# Patient Record
Sex: Male | Born: 1970 | Race: White | Hispanic: No | Marital: Married
Health system: Southern US, Community
[De-identification: ages and names within clinical notes are randomized; demographics above are authoritative.]

---

## 2006-01-12 ENCOUNTER — Ambulatory Visit: Payer: Self-pay | Admitting: Family Medicine

## 2006-02-18 ENCOUNTER — Ambulatory Visit: Payer: Self-pay | Admitting: Family Medicine

## 2006-12-15 ENCOUNTER — Ambulatory Visit: Payer: Self-pay | Admitting: Family Medicine

## 2006-12-26 ENCOUNTER — Encounter: Admission: RE | Admit: 2006-12-26 | Discharge: 2006-12-26 | Payer: Self-pay | Admitting: Family Medicine

## 2007-04-15 ENCOUNTER — Ambulatory Visit: Payer: Self-pay | Admitting: Family Medicine

## 2008-02-28 ENCOUNTER — Ambulatory Visit: Payer: Self-pay | Admitting: Family Medicine

## 2009-04-11 ENCOUNTER — Ambulatory Visit: Payer: Self-pay | Admitting: Family Medicine

## 2010-02-06 ENCOUNTER — Ambulatory Visit: Payer: Self-pay | Admitting: Family Medicine

## 2016-08-19 DIAGNOSIS — Z0000 Encounter for general adult medical examination without abnormal findings: Secondary | ICD-10-CM | POA: Diagnosis not present

## 2016-08-19 DIAGNOSIS — Z125 Encounter for screening for malignant neoplasm of prostate: Secondary | ICD-10-CM | POA: Diagnosis not present

## 2016-08-19 DIAGNOSIS — R7303 Prediabetes: Secondary | ICD-10-CM | POA: Diagnosis not present

## 2016-08-26 DIAGNOSIS — M25562 Pain in left knee: Secondary | ICD-10-CM | POA: Diagnosis not present

## 2016-08-26 DIAGNOSIS — Z0000 Encounter for general adult medical examination without abnormal findings: Secondary | ICD-10-CM | POA: Diagnosis not present

## 2016-08-26 DIAGNOSIS — R7303 Prediabetes: Secondary | ICD-10-CM | POA: Diagnosis not present

## 2017-02-17 DIAGNOSIS — M179 Osteoarthritis of knee, unspecified: Secondary | ICD-10-CM | POA: Diagnosis not present

## 2017-02-17 DIAGNOSIS — M25562 Pain in left knee: Secondary | ICD-10-CM | POA: Diagnosis not present

## 2017-07-31 DIAGNOSIS — K045 Chronic apical periodontitis: Secondary | ICD-10-CM | POA: Diagnosis not present

## 2017-09-22 DIAGNOSIS — R7303 Prediabetes: Secondary | ICD-10-CM | POA: Diagnosis not present

## 2017-09-22 DIAGNOSIS — Z125 Encounter for screening for malignant neoplasm of prostate: Secondary | ICD-10-CM | POA: Diagnosis not present

## 2017-09-22 DIAGNOSIS — Z0000 Encounter for general adult medical examination without abnormal findings: Secondary | ICD-10-CM | POA: Diagnosis not present

## 2017-09-29 DIAGNOSIS — R030 Elevated blood-pressure reading, without diagnosis of hypertension: Secondary | ICD-10-CM | POA: Diagnosis not present

## 2017-09-29 DIAGNOSIS — Z0000 Encounter for general adult medical examination without abnormal findings: Secondary | ICD-10-CM | POA: Diagnosis not present

## 2018-09-28 DIAGNOSIS — Z0000 Encounter for general adult medical examination without abnormal findings: Secondary | ICD-10-CM | POA: Diagnosis not present

## 2018-10-05 DIAGNOSIS — R7303 Prediabetes: Secondary | ICD-10-CM | POA: Diagnosis not present

## 2018-10-05 DIAGNOSIS — Z0000 Encounter for general adult medical examination without abnormal findings: Secondary | ICD-10-CM | POA: Diagnosis not present

## 2018-10-05 DIAGNOSIS — Z7189 Other specified counseling: Secondary | ICD-10-CM | POA: Diagnosis not present

## 2019-04-19 DIAGNOSIS — H10413 Chronic giant papillary conjunctivitis, bilateral: Secondary | ICD-10-CM | POA: Diagnosis not present

## 2019-04-19 DIAGNOSIS — H524 Presbyopia: Secondary | ICD-10-CM | POA: Diagnosis not present

## 2019-04-19 DIAGNOSIS — H52202 Unspecified astigmatism, left eye: Secondary | ICD-10-CM | POA: Diagnosis not present

## 2019-10-18 DIAGNOSIS — Z125 Encounter for screening for malignant neoplasm of prostate: Secondary | ICD-10-CM | POA: Diagnosis not present

## 2019-10-18 DIAGNOSIS — R7303 Prediabetes: Secondary | ICD-10-CM | POA: Diagnosis not present

## 2019-10-18 DIAGNOSIS — Z0000 Encounter for general adult medical examination without abnormal findings: Secondary | ICD-10-CM | POA: Diagnosis not present

## 2019-10-18 DIAGNOSIS — Z13220 Encounter for screening for lipoid disorders: Secondary | ICD-10-CM | POA: Diagnosis not present

## 2019-10-25 DIAGNOSIS — R7303 Prediabetes: Secondary | ICD-10-CM | POA: Diagnosis not present

## 2019-10-25 DIAGNOSIS — M25511 Pain in right shoulder: Secondary | ICD-10-CM | POA: Diagnosis not present

## 2019-10-25 DIAGNOSIS — Z0000 Encounter for general adult medical examination without abnormal findings: Secondary | ICD-10-CM | POA: Diagnosis not present

## 2020-01-17 DIAGNOSIS — M25511 Pain in right shoulder: Secondary | ICD-10-CM | POA: Diagnosis not present

## 2020-01-17 DIAGNOSIS — M7501 Adhesive capsulitis of right shoulder: Secondary | ICD-10-CM | POA: Diagnosis not present

## 2020-01-23 DIAGNOSIS — M7501 Adhesive capsulitis of right shoulder: Secondary | ICD-10-CM | POA: Diagnosis not present

## 2020-01-23 DIAGNOSIS — M25511 Pain in right shoulder: Secondary | ICD-10-CM | POA: Diagnosis not present

## 2020-02-01 DIAGNOSIS — M25511 Pain in right shoulder: Secondary | ICD-10-CM | POA: Diagnosis not present

## 2020-02-01 DIAGNOSIS — M7501 Adhesive capsulitis of right shoulder: Secondary | ICD-10-CM | POA: Diagnosis not present

## 2020-02-21 DIAGNOSIS — M7501 Adhesive capsulitis of right shoulder: Secondary | ICD-10-CM | POA: Diagnosis not present

## 2020-02-21 DIAGNOSIS — M25511 Pain in right shoulder: Secondary | ICD-10-CM | POA: Diagnosis not present

## 2020-09-13 DIAGNOSIS — R350 Frequency of micturition: Secondary | ICD-10-CM | POA: Diagnosis not present

## 2020-09-15 ENCOUNTER — Other Ambulatory Visit: Payer: Self-pay

## 2020-09-15 ENCOUNTER — Encounter (HOSPITAL_COMMUNITY): Payer: Self-pay | Admitting: Emergency Medicine

## 2020-09-15 ENCOUNTER — Emergency Department (HOSPITAL_COMMUNITY)
Admission: EM | Admit: 2020-09-15 | Discharge: 2020-09-15 | Disposition: A | Discharge status: Home / Self Care | Payer: BC Managed Care – PPO | Attending: Emergency Medicine | Admitting: Emergency Medicine

## 2020-09-15 ENCOUNTER — Emergency Department (HOSPITAL_COMMUNITY): Payer: BC Managed Care – PPO

## 2020-09-15 DIAGNOSIS — N23 Unspecified renal colic: Secondary | ICD-10-CM

## 2020-09-15 DIAGNOSIS — N201 Calculus of ureter: Secondary | ICD-10-CM | POA: Diagnosis not present

## 2020-09-15 DIAGNOSIS — R109 Unspecified abdominal pain: Secondary | ICD-10-CM | POA: Diagnosis not present

## 2020-09-15 LAB — URINALYSIS, ROUTINE W REFLEX MICROSCOPIC
Bilirubin Urine: NEGATIVE
Glucose, UA: NEGATIVE mg/dL
Ketones, ur: NEGATIVE mg/dL
Leukocytes,Ua: NEGATIVE
Nitrite: NEGATIVE
Protein, ur: 30 mg/dL — AB
Specific Gravity, Urine: 1.015 (ref 1.005–1.030)
pH: 8 (ref 5.0–8.0)

## 2020-09-15 MED ORDER — KETOROLAC TROMETHAMINE 60 MG/2ML IM SOLN
30.0000 mg | Freq: Once | INTRAMUSCULAR | Status: AC
  Administered 2020-09-15: 30 mg via INTRAMUSCULAR
  Filled 2020-09-15: qty 2

## 2020-09-15 MED ORDER — IBUPROFEN 600 MG PO TABS: 600.0000 mg | ORAL_TABLET | Freq: Four times a day (QID) | ORAL | 0 refills | Status: AC | PRN

## 2020-09-15 MED ORDER — TAMSULOSIN HCL 0.4 MG PO CAPS
0.4000 mg | ORAL_CAPSULE | Freq: Once | ORAL | Status: AC
  Administered 2020-09-15: 0.4 mg via ORAL
  Filled 2020-09-15: qty 1

## 2020-09-15 MED ORDER — TAMSULOSIN HCL 0.4 MG PO CAPS: 0.4000 mg | ORAL_CAPSULE | Freq: Every day | ORAL | 0 refills | Status: AC

## 2020-09-15 NOTE — ED Triage Notes (Signed)
Pt reports right sided abdominal pain that began Wednesday. Pain radiates from right side to lower abdomen. Pt was seen by PCP for complaints. Describes pain is "locked up" and cramping. Pt also reports dysuria.

## 2020-09-15 NOTE — ED Notes (Signed)
RN called lab for update on urine sample. Lab is running it now.

## 2020-09-15 NOTE — ED Notes (Signed)
Patient transported to CT 

## 2020-09-15 NOTE — ED Provider Notes (Signed)
[facility]-EMERGENCY DEPT Provider Note  CSN: [id number] Arrival date & time: 09/15/20 0013  Chief Complaint(s) Abdominal Pain and Dysuria  HPI Marco King is a 50 y.o. male here for right flank pain. Intermittent since Wednesday. Associated with dysuria and urgency. Pain became significantly worse 30 minutes prior to arrival. Reports that it was deep and intense.  Radiating from the right flank down to the growing area. Associated with nausea without emesis. Patient has since subsided since being here.  But is fluctuating in intensity. No recent fevers or infections. No coughing or congestion.. No diarrhea.  Patient reports being seen by PCP and had a UA that did not show any infection but he was prescribed doxycycline in case there was an infection.  He has taken 3 doses.  No known h/o renal stones.   HPI  Past Medical History History reviewed. No pertinent past medical history. There are no problems to display for this patient.  Home Medication(s) Prior to Admission medications   Medication Sig Start Date End Date Taking? Authorizing Provider  ibuprofen (ADVIL) 600 MG tablet Take 1 tablet (600 mg total) by mouth every 6 (six) hours as needed. 09/15/20  Yes Coburn Knaus, Amadeo Garnet, MD  tamsulosin (FLOMAX) 0.4 MG CAPS capsule Take 1 capsule (0.4 mg total) by mouth daily for 7 days. 09/15/20 09/22/20 Yes Moksh Loomer, Amadeo Garnet, MD                                                                                                                                    Past Surgical History History reviewed. No pertinent surgical history. Family History No family history on file.  Social History   Allergies Patient has no known allergies.  Review of Systems Review of Systems All other systems are reviewed and are negative for acute change except as noted in the HPI  Physical Exam Vital Signs  I have reviewed the triage vital signs BP (!) 151/108   Pulse  70   Temp 98.3 F (36.8 C) (Oral)   Resp 17   Ht 6\' 2"  (1.88 m)   Wt 90.7 kg   SpO2 99%   BMI 25.68 kg/m   Physical Exam Vitals reviewed.  Constitutional:      General: He is not in acute distress.    Appearance: He is well-developed. He is not diaphoretic.  HENT:     Head: Normocephalic and atraumatic.     Jaw: No trismus.     Right Ear: External ear normal.     Left Ear: External ear normal.     Nose: Nose normal.  Eyes:     General: No scleral icterus.    Conjunctiva/sclera: Conjunctivae normal.  Neck:     Trachea: Phonation normal.  Cardiovascular:     Rate and Rhythm: Normal rate and regular rhythm.  Pulmonary:     Effort: Pulmonary effort is normal. No respiratory distress.  Breath sounds: No stridor.  Abdominal:     General: There is no distension.  Musculoskeletal:        General: Normal range of motion.     Cervical back: Normal range of motion.  Neurological:     Mental Status: He is alert and oriented to person, place, and time.  Psychiatric:        Behavior: Behavior normal.     ED Results and Treatments Labs (all labs ordered are listed, but only abnormal results are displayed) Labs Reviewed  URINALYSIS, ROUTINE W REFLEX MICROSCOPIC - Abnormal; Notable for the following components:      Result Value   APPearance CLOUDY (*)    Hgb urine dipstick SMALL (*)    Protein, ur 30 (*)    Bacteria, UA RARE (*)    All other components within normal limits                                                                                                                         EKG  EKG Interpretation  Date/Time:    Ventricular Rate:    PR Interval:    QRS Duration:   QT Interval:    QTC Calculation:   R Axis:     Text Interpretation:        Radiology No results found.  CT STONE STUDY:  IMPRESSION: 1. Mild right hydronephrosis and hydroureter with stranding around the right ureter. Stone in the right posterior bladder wall region likely  represents a retained stone in the right ureterovesical junction although a recently passed stone could also have this appearance. 2. No stones or hydronephrosis on the left.  Pertinent labs & imaging results that were available during my care of the patient were reviewed by me and considered in my medical decision making (see chart for details).  Medications Ordered in ED Medications  tamsulosin (FLOMAX) capsule 0.4 mg (has no administration in time range)  ketorolac (TORADOL) injection 30 mg (30 mg Intramuscular Given 09/15/20 0106)                                                                                                                                    Procedures Procedures  (including critical care time)  Medical Decision Making / ED Course I have reviewed the nursing notes for this encounter and the patient's prior records (if available in EHR or on provided paperwork).  Marco King was evaluated in Emergency Department on 09/15/2020 for the symptoms described in the history of present illness. He was evaluated in the context of the global COVID-19 pandemic, which necessitated consideration that the patient might be at risk for infection with the SARS-CoV-2 virus that causes COVID-19. Institutional protocols and algorithms that pertain to the evaluation of patients at risk for COVID-19 are in a state of rapid change based on information released by regulatory bodies including the CDC and federal and state organizations. These policies and algorithms were followed during the patient's care in the ED.  Right renal colic from 74mm stone. UA w/o evidence of infection. Pain controlled with Toradol. Given flomax.       Final Clinical Impression(s) / ED Diagnoses Final diagnoses:  Renal colic on right side  Ureterolithiasis   The patient appears reasonably screened and/or stabilized for discharge and I doubt any other medical condition or other [facility] requiring further  screening, evaluation, or treatment in the ED at this time prior to discharge. Safe for discharge with strict return precautions.  Disposition: Discharge  Condition: Good  I have discussed the results, Dx and Tx plan with the patient/family who expressed understanding and agree(s) with the plan. Discharge instructions discussed at length. The patient/family was given strict return precautions who verbalized understanding of the instructions. No further questions at time of discharge.    ED Discharge Orders         Ordered    ibuprofen (ADVIL) 600 MG tablet  Every 6 hours PRN        09/15/20 0243    tamsulosin (FLOMAX) 0.4 MG CAPS capsule  Daily        09/15/20 0243          Follow Up: Georgianne Fick, MD [address] [place] [address] [place] [address] [phone]  Call  as needed  Crista Elliot, MD [address] [place] [address] [phone]  Call  as needed      This chart was dictated using voice recognition software.  Despite best efforts to proofread,  errors can occur which can change the documentation meaning.   Nira Conn, MD 09/15/20 [phone]

## 2020-09-15 NOTE — ED Notes (Signed)
Patient back from CT.

## 2020-10-17 DIAGNOSIS — N200 Calculus of kidney: Secondary | ICD-10-CM | POA: Diagnosis not present

## 2020-10-24 DIAGNOSIS — R7303 Prediabetes: Secondary | ICD-10-CM | POA: Diagnosis not present

## 2020-10-24 DIAGNOSIS — R030 Elevated blood-pressure reading, without diagnosis of hypertension: Secondary | ICD-10-CM | POA: Diagnosis not present

## 2020-10-24 DIAGNOSIS — N200 Calculus of kidney: Secondary | ICD-10-CM | POA: Diagnosis not present

## 2020-10-30 DIAGNOSIS — Z125 Encounter for screening for malignant neoplasm of prostate: Secondary | ICD-10-CM | POA: Diagnosis not present

## 2020-10-30 DIAGNOSIS — Z0000 Encounter for general adult medical examination without abnormal findings: Secondary | ICD-10-CM | POA: Diagnosis not present

## 2020-11-13 DIAGNOSIS — R7303 Prediabetes: Secondary | ICD-10-CM | POA: Diagnosis not present

## 2020-11-13 DIAGNOSIS — Z0000 Encounter for general adult medical examination without abnormal findings: Secondary | ICD-10-CM | POA: Diagnosis not present

## 2020-11-13 DIAGNOSIS — N200 Calculus of kidney: Secondary | ICD-10-CM | POA: Diagnosis not present

## 2020-12-03 DIAGNOSIS — Z1211 Encounter for screening for malignant neoplasm of colon: Secondary | ICD-10-CM | POA: Diagnosis not present

## 2020-12-03 DIAGNOSIS — Z800 Family history of malignant neoplasm of digestive organs: Secondary | ICD-10-CM | POA: Diagnosis not present

## 2021-01-15 DIAGNOSIS — Z1211 Encounter for screening for malignant neoplasm of colon: Secondary | ICD-10-CM | POA: Diagnosis not present

## 2021-06-20 DIAGNOSIS — H5212 Myopia, left eye: Secondary | ICD-10-CM | POA: Diagnosis not present

## 2021-06-20 DIAGNOSIS — H10413 Chronic giant papillary conjunctivitis, bilateral: Secondary | ICD-10-CM | POA: Diagnosis not present

## 2021-11-19 DIAGNOSIS — R7303 Prediabetes: Secondary | ICD-10-CM | POA: Diagnosis not present

## 2021-11-19 DIAGNOSIS — Z0000 Encounter for general adult medical examination without abnormal findings: Secondary | ICD-10-CM | POA: Diagnosis not present

## 2021-11-19 DIAGNOSIS — Z125 Encounter for screening for malignant neoplasm of prostate: Secondary | ICD-10-CM | POA: Diagnosis not present

## 2021-12-03 DIAGNOSIS — R7303 Prediabetes: Secondary | ICD-10-CM | POA: Diagnosis not present

## 2021-12-03 DIAGNOSIS — Z0000 Encounter for general adult medical examination without abnormal findings: Secondary | ICD-10-CM | POA: Diagnosis not present

## 2021-12-18 DIAGNOSIS — Z23 Encounter for immunization: Secondary | ICD-10-CM | POA: Diagnosis not present

## 2022-03-18 DIAGNOSIS — Z23 Encounter for immunization: Secondary | ICD-10-CM | POA: Diagnosis not present

## 2022-06-09 IMAGING — CT CT RENAL STONE PROTOCOL
2 of 4 series · 16 of 46 positions shown, 18 images · non-contrast
Comparison: 12/26/2006

CLINICAL DATA: Right-sided abdominal and flank pain extending to
the lower abdomen.

EXAM:
CT ABDOMEN AND PELVIS WITHOUT CONTRAST
TECHNIQUE: Multidetector CT imaging of the abdomen and pelvis was performed
following the standard protocol without IV contrast.

[Series 2: axial st · axial · 0.84mm/px · z∈[-503,-73]mm · 13 of 98 slices shown, 15 images]
[im 6/98  soft-tissue]
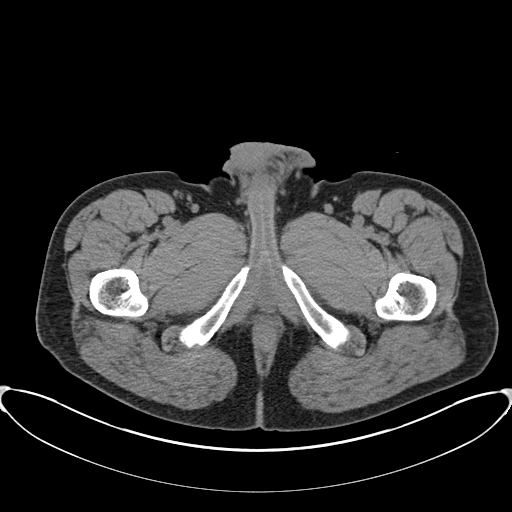
[im 6/98  bone]
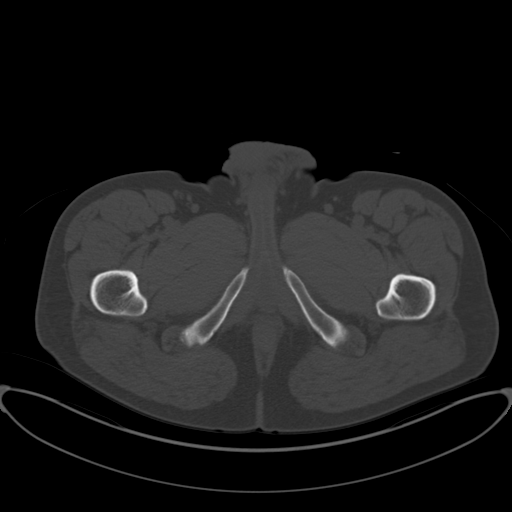
[im 12/98  soft-tissue]
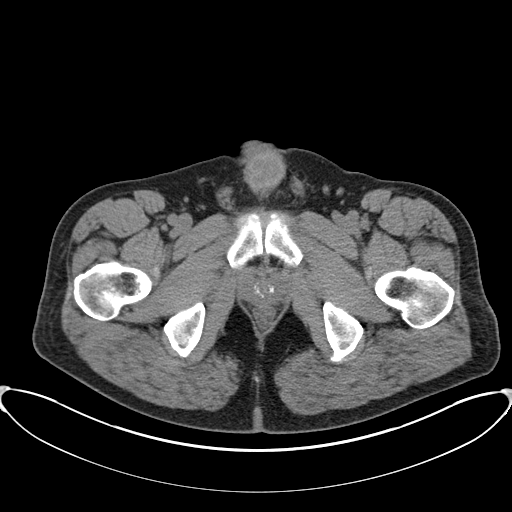
[im 23/98  soft-tissue]
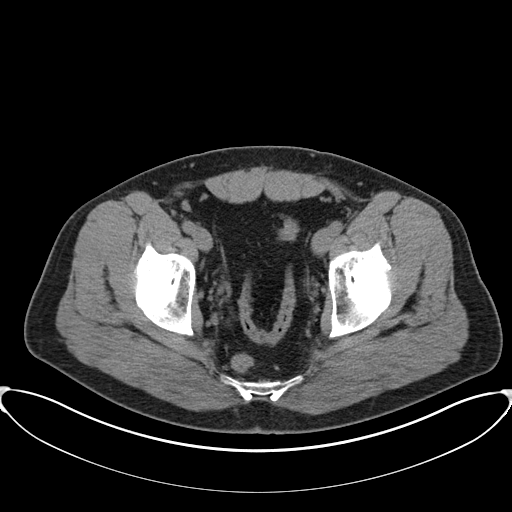
[im 29/98  soft-tissue]
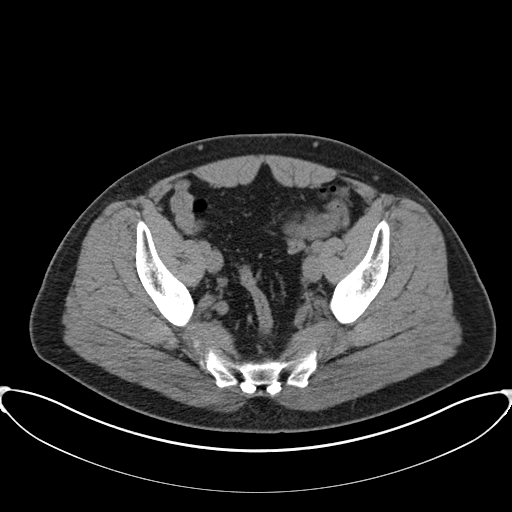
[im 35/98  soft-tissue]
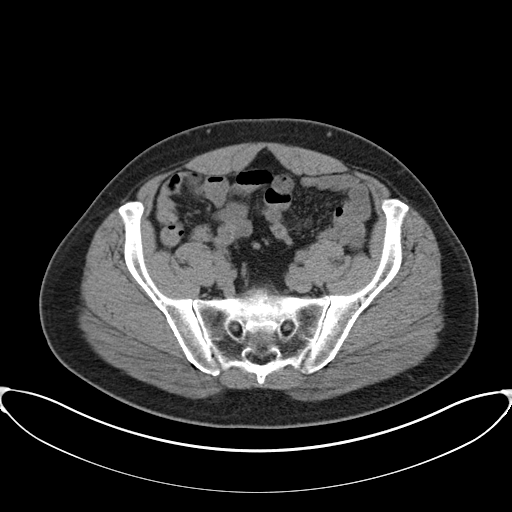
[im 40/98  soft-tissue]
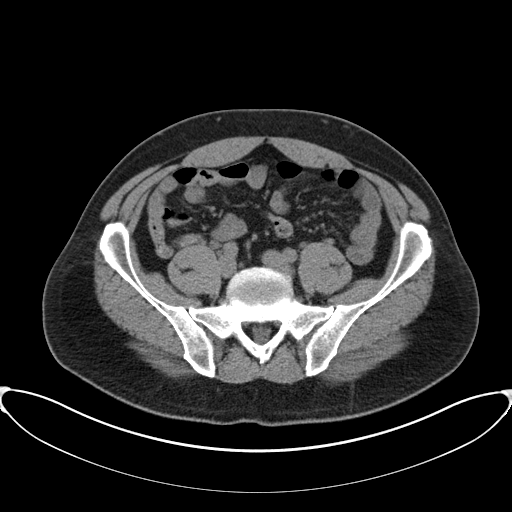
[im 52/98  soft-tissue]
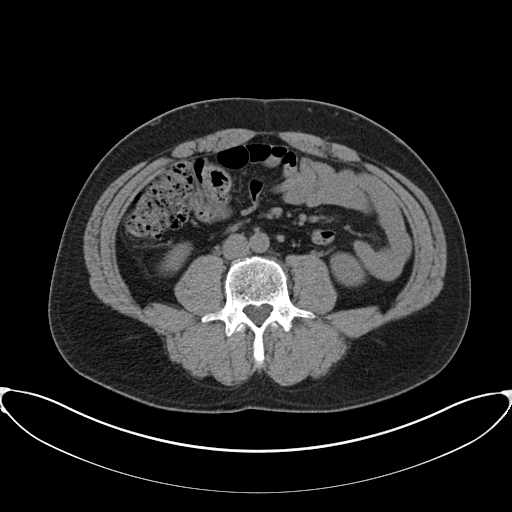
[im 58/98  soft-tissue]
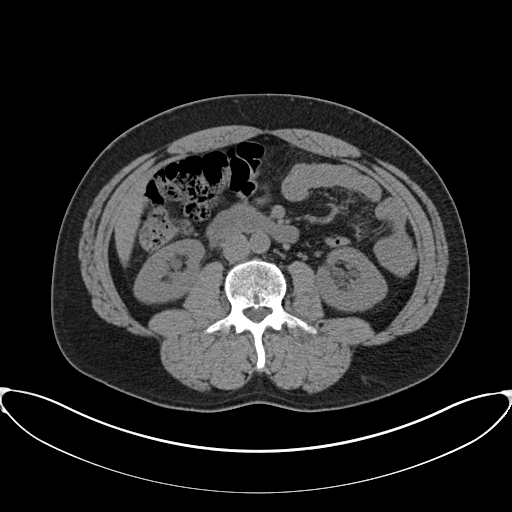
[im 63/98  soft-tissue]
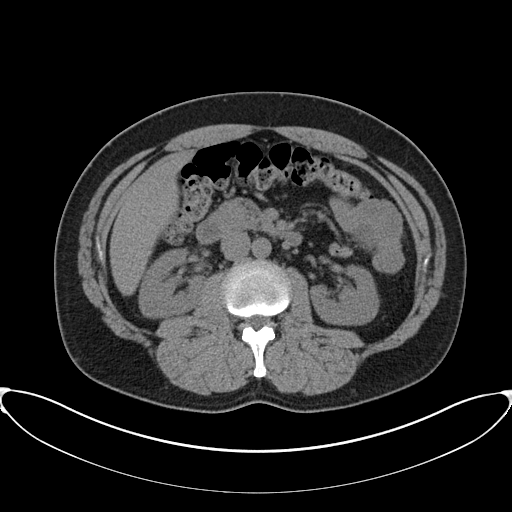
[im 63/98  bone]
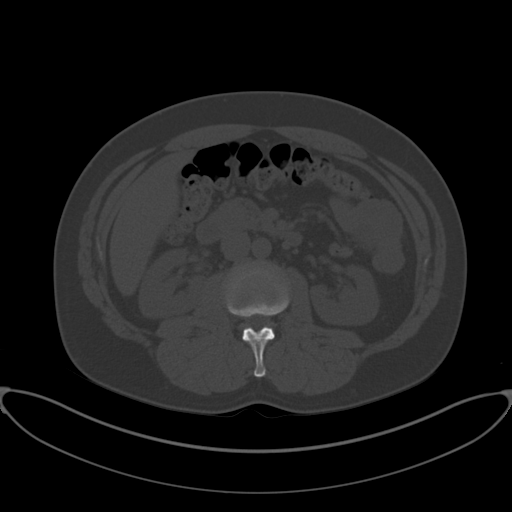
[im 69/98  soft-tissue]
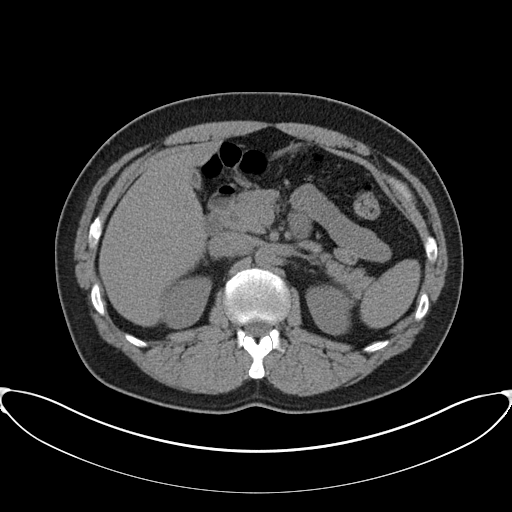
[im 75/98  soft-tissue]
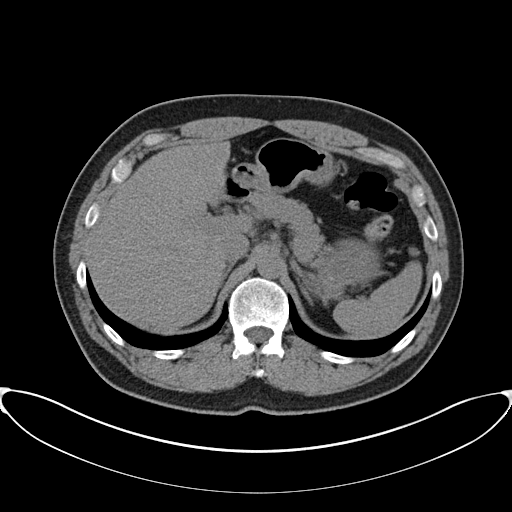
[im 86/98  soft-tissue]
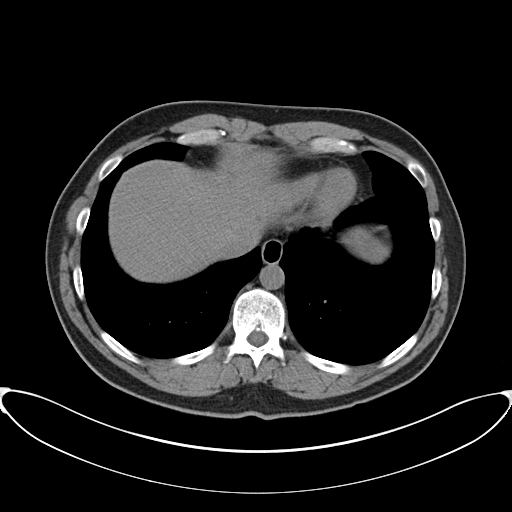
[im 92/98  soft-tissue]
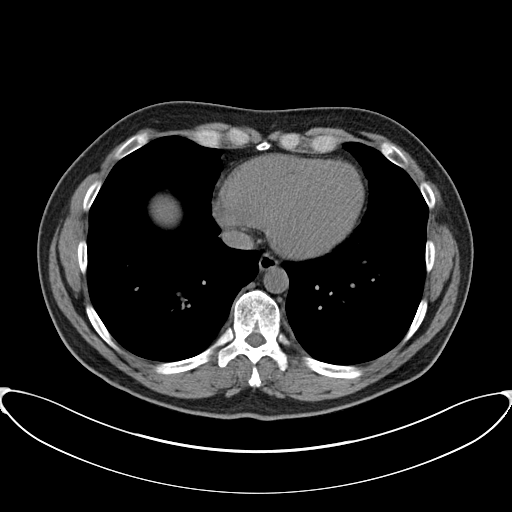

[Series 5: coronal · coronal · 0.77mm/px · 3 of 150 slices shown]
[im 50/150  soft-tissue]
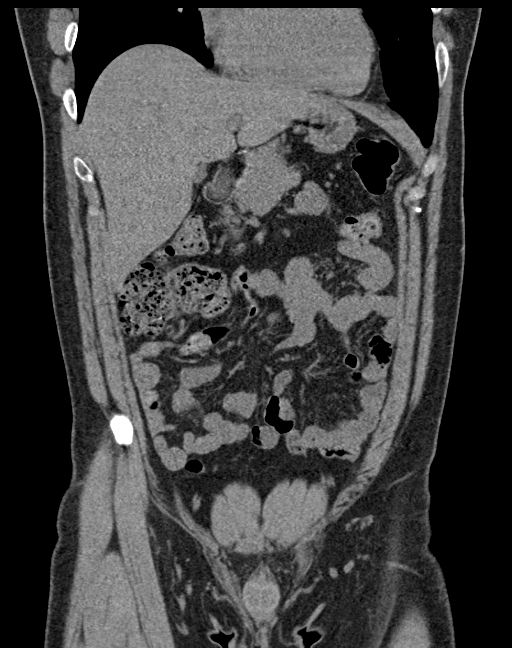
[im 67/150  soft-tissue]
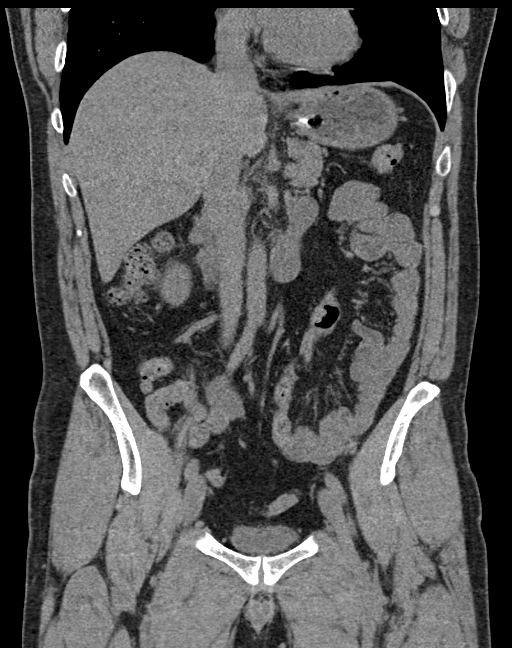
[im 83/150  soft-tissue]
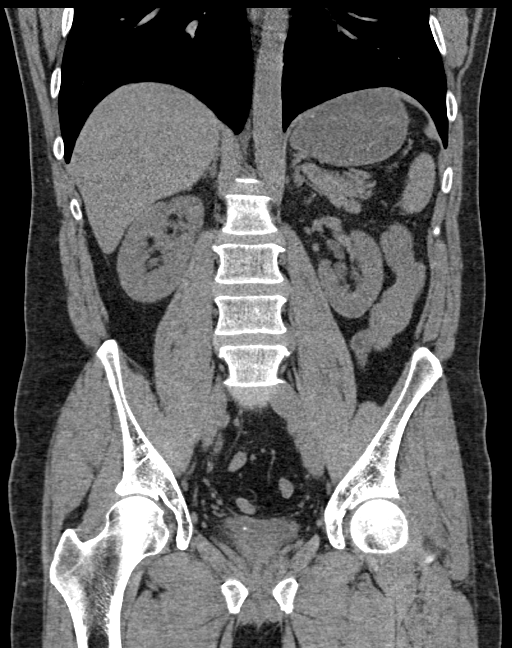

[16 of 46 positions shown; findings below may reference images not displayed]

FINDINGS: Lower chest: The lung bases are clear.

Hepatobiliary: No focal liver abnormality is seen. No gallstones,
gallbladder wall thickening, or biliary dilatation.

Pancreas: Unremarkable. No pancreatic ductal dilatation or
surrounding inflammatory changes.

Spleen: Normal in size without focal abnormality.

Adrenals/Urinary Tract: No adrenal gland nodules. Mild right
hydronephrosis and hydroureter with mild stranding around the right
ureter. Stone in the right posterior bladder wall region likely
represents a retained stone in the right ureterovesical junction
although a recently passed stone could also have this appearance.
The stone measures about 5 mm in diameter. No stones or
hydronephrosis on the left. Bladder is mostly decompressed.

Stomach/Bowel: Stomach is within normal limits. Appendix appears
normal. No evidence of bowel wall thickening, distention, or
inflammatory changes.

Vascular/Lymphatic: No significant vascular findings are present. No
enlarged abdominal or pelvic lymph nodes.

Reproductive: Prostate is unremarkable.

Other: No abdominal wall hernia or abnormality. No abdominopelvic
ascites.

Musculoskeletal: No acute or significant osseous findings.
IMPRESSION: 1. Mild right hydronephrosis and hydroureter with stranding around
the right ureter. Stone in the right posterior bladder wall region
likely represents a retained stone in the right ureterovesical
junction although a recently passed stone could also have this
appearance.
2. No stones or hydronephrosis on the left.

## 2022-06-25 DIAGNOSIS — H524 Presbyopia: Secondary | ICD-10-CM | POA: Diagnosis not present

## 2022-06-25 DIAGNOSIS — H10413 Chronic giant papillary conjunctivitis, bilateral: Secondary | ICD-10-CM | POA: Diagnosis not present

## 2022-12-08 DIAGNOSIS — Z125 Encounter for screening for malignant neoplasm of prostate: Secondary | ICD-10-CM | POA: Diagnosis not present

## 2022-12-08 DIAGNOSIS — Z0000 Encounter for general adult medical examination without abnormal findings: Secondary | ICD-10-CM | POA: Diagnosis not present

## 2022-12-08 DIAGNOSIS — R7303 Prediabetes: Secondary | ICD-10-CM | POA: Diagnosis not present

## 2022-12-23 DIAGNOSIS — Z0000 Encounter for general adult medical examination without abnormal findings: Secondary | ICD-10-CM | POA: Diagnosis not present

## 2022-12-23 DIAGNOSIS — R7303 Prediabetes: Secondary | ICD-10-CM | POA: Diagnosis not present

## 2022-12-23 DIAGNOSIS — Z125 Encounter for screening for malignant neoplasm of prostate: Secondary | ICD-10-CM | POA: Diagnosis not present

## 2023-03-16 DIAGNOSIS — Z800 Family history of malignant neoplasm of digestive organs: Secondary | ICD-10-CM | POA: Diagnosis not present

## 2023-03-16 DIAGNOSIS — K641 Second degree hemorrhoids: Secondary | ICD-10-CM | POA: Diagnosis not present

## 2023-09-09 DIAGNOSIS — M25512 Pain in left shoulder: Secondary | ICD-10-CM | POA: Diagnosis not present

## 2023-12-30 DIAGNOSIS — Z0000 Encounter for general adult medical examination without abnormal findings: Secondary | ICD-10-CM | POA: Diagnosis not present

## 2023-12-30 DIAGNOSIS — R7303 Prediabetes: Secondary | ICD-10-CM | POA: Diagnosis not present

## 2023-12-30 DIAGNOSIS — Z125 Encounter for screening for malignant neoplasm of prostate: Secondary | ICD-10-CM | POA: Diagnosis not present

## 2024-01-13 DIAGNOSIS — Z0000 Encounter for general adult medical examination without abnormal findings: Secondary | ICD-10-CM | POA: Diagnosis not present

## 2024-01-13 DIAGNOSIS — R7303 Prediabetes: Secondary | ICD-10-CM | POA: Diagnosis not present

## 2024-05-04 DIAGNOSIS — M79671 Pain in right foot: Secondary | ICD-10-CM | POA: Diagnosis not present
